# Patient Record
Sex: Male | Born: 2000 | Race: White | Hispanic: No | Smoking: Never smoker
Health system: Southern US, Community
[De-identification: ages and names within clinical notes are randomized; demographics above are authoritative.]

## PROBLEM LIST (undated history)

## (undated) HISTORY — PX: APPENDECTOMY: SHX54

---
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
## 2023-01-12 ENCOUNTER — Encounter (HOSPITAL_COMMUNITY): Payer: Self-pay | Admitting: Emergency Medicine
